# Patient Record
Sex: Female | Born: 1970 | Race: Black or African American | Hispanic: No | Marital: Married | Smoking: Current every day smoker
Health system: Southern US, Community
[De-identification: ages and names within clinical notes are randomized; demographics above are authoritative.]

## PROBLEM LIST (undated history)

## (undated) HISTORY — PX: TUBAL LIGATION: SHX77

---

## 2004-04-13 ENCOUNTER — Emergency Department (HOSPITAL_COMMUNITY): Admission: EM | Admit: 2004-04-13 | Discharge: 2004-04-13 | Payer: Self-pay | Admitting: Emergency Medicine

## 2006-07-05 ENCOUNTER — Emergency Department (HOSPITAL_COMMUNITY): Admission: EM | Admit: 2006-07-05 | Discharge: 2006-07-05 | Payer: Self-pay | Admitting: Emergency Medicine

## 2007-01-11 ENCOUNTER — Emergency Department (HOSPITAL_COMMUNITY): Admission: EM | Admit: 2007-01-11 | Discharge: 2007-01-11 | Payer: Self-pay | Admitting: Emergency Medicine

## 2010-05-12 ENCOUNTER — Emergency Department (HOSPITAL_COMMUNITY): Admission: EM | Admit: 2010-05-12 | Discharge: 2010-05-12 | Payer: Self-pay | Admitting: Family Medicine

## 2010-06-09 ENCOUNTER — Emergency Department (HOSPITAL_COMMUNITY): Admission: EM | Admit: 2010-06-09 | Discharge: 2010-06-09 | Payer: Self-pay | Admitting: Emergency Medicine

## 2011-02-11 LAB — POCT I-STAT, CHEM 8
BUN: 5 mg/dL — ABNORMAL LOW (ref 6–23)
Calcium, Ion: 1.19 mmol/L (ref 1.12–1.32)
Chloride: 103 mEq/L (ref 96–112)
Creatinine, Ser: 0.8 mg/dL (ref 0.4–1.2)
Potassium: 4.1 mEq/L (ref 3.5–5.1)
TCO2: 26 mmol/L (ref 0–100)

## 2013-05-06 ENCOUNTER — Emergency Department (HOSPITAL_COMMUNITY)
Admission: EM | Admit: 2013-05-06 | Discharge: 2013-05-06 | Disposition: A | Discharge status: Home / Self Care | Payer: Self-pay | Attending: Emergency Medicine | Admitting: Emergency Medicine

## 2013-05-06 ENCOUNTER — Encounter (HOSPITAL_COMMUNITY): Payer: Self-pay | Admitting: Emergency Medicine

## 2013-05-06 DIAGNOSIS — K0889 Other specified disorders of teeth and supporting structures: Secondary | ICD-10-CM

## 2013-05-06 DIAGNOSIS — F17200 Nicotine dependence, unspecified, uncomplicated: Secondary | ICD-10-CM | POA: Insufficient documentation

## 2013-05-06 DIAGNOSIS — H9209 Otalgia, unspecified ear: Secondary | ICD-10-CM | POA: Insufficient documentation

## 2013-05-06 DIAGNOSIS — K089 Disorder of teeth and supporting structures, unspecified: Secondary | ICD-10-CM | POA: Insufficient documentation

## 2013-05-06 DIAGNOSIS — K029 Dental caries, unspecified: Secondary | ICD-10-CM | POA: Insufficient documentation

## 2013-05-06 DIAGNOSIS — R51 Headache: Secondary | ICD-10-CM | POA: Insufficient documentation

## 2013-05-06 MED ORDER — PENICILLIN V POTASSIUM 500 MG PO TABS: 500.0000 mg | ORAL_TABLET | Freq: Four times a day (QID) | ORAL | Status: DC

## 2013-05-06 MED ORDER — HYDROCODONE-ACETAMINOPHEN 5-325 MG PO TABS: 2.0000 | ORAL_TABLET | ORAL | Status: DC | PRN

## 2013-05-06 MED ORDER — HYDROCODONE-ACETAMINOPHEN 5-325 MG PO TABS
2.0000 | ORAL_TABLET | Freq: Once | ORAL | Status: AC
  Administered 2013-05-06: 2 via ORAL
  Filled 2013-05-06: qty 2

## 2013-05-06 NOTE — ED Provider Notes (Signed)
  Medical screening examination/treatment/procedure(s) were performed by non-physician practitioner and as supervising physician I was immediately available for consultation/collaboration.    Tanika Bracco, MD 05/06/13 2356 

## 2013-05-06 NOTE — ED Provider Notes (Signed)
History     CSN: [id number]  Arrival date & time 05/06/13  1927   First MD Initiated Contact with Patient 05/06/13 2032      Chief Complaint  Patient presents with  . Dental Pain    (Consider location/radiation/quality/duration/timing/severity/associated sxs/prior treatment) The history is provided by the patient and medical records. No language interpreter was used.    Sherry Figueroa is a 42 y.o. female  with no medical problems presents to the Emergency Department complaining of gradual, persistent, progressively worsening pain in the upper right molar beginning yesterday and worsening today.  Pt states she has a hole in the tooth in question which has been there for some time.  Pt has taken OTC ibuprofen with only minor relief. Associated symptoms include headache.  Ibuprofen makes it better and nothing makes it worse.  Pt denies fevers, chills, neck pain, nausea, vomiting, otalgia, rash.  Pt does not have a regular dentist and has never had any teeth extracted.  Marland Kitchen     History reviewed. No pertinent past medical history.  Past Surgical History  Procedure Laterality Date  . Tubal ligation      No family history on file.  History  Substance Use Topics  . Smoking status: Current Every Day Smoker  . Smokeless tobacco: Not on file  . Alcohol Use: No    OB History   Grav Para Term Preterm Abortions TAB SAB Ect Mult Living                  Review of Systems  Constitutional: Negative for fever, chills and appetite change.  HENT: Positive for ear pain and dental problem. Negative for nosebleeds, facial swelling, rhinorrhea, drooling, trouble swallowing, neck pain, neck stiffness and postnasal drip.   Eyes: Negative for pain and redness.  Respiratory: Negative for cough and wheezing.   Cardiovascular: Negative for chest pain.  Gastrointestinal: Negative for nausea, vomiting and abdominal pain.  Skin: Negative for color change and rash.  Neurological: Positive for  headaches. Negative for weakness and light-headedness.  All other systems reviewed and are negative.    Allergies  Review of patient's allergies indicates no known allergies.  Home Medications   Current Outpatient Rx  Name  Route  Sig  Dispense  Refill  . ibuprofen (ADVIL,MOTRIN) 800 MG tablet   Oral   Take 800 mg by mouth once.         Marland Kitchen HYDROcodone-acetaminophen (NORCO/VICODIN) 5-325 MG per tablet   Oral   Take 2 tablets by mouth every 4 (four) hours as needed for pain.   6 tablet   0   . penicillin v potassium (VEETID) 500 MG tablet   Oral   Take 1 tablet (500 mg total) by mouth 4 (four) times daily.   40 tablet   0     BP 125/84  Pulse 91  Temp(Src) 99.4 F (37.4 C) (Oral)  Resp 22  SpO2 100%  LMP 05/03/2013  Physical Exam  Nursing note and vitals reviewed. Constitutional: She is oriented to person, place, and time. She appears well-developed and well-nourished.  HENT:  Head: Normocephalic.  Right Ear: Tympanic membrane, external ear and ear canal normal.  Left Ear: Tympanic membrane, external ear and ear canal normal.  Nose: Nose normal. Right sinus exhibits no maxillary sinus tenderness and no frontal sinus tenderness. Left sinus exhibits no maxillary sinus tenderness and no frontal sinus tenderness.  Mouth/Throat: Uvula is midline, oropharynx is clear and moist and mucous membranes are normal.  No oral lesions. Abnormal dentition. Dental caries present. No dental abscesses, edematous or lacerations. No oropharyngeal exudate, posterior oropharyngeal edema, posterior oropharyngeal erythema or tonsillar abscesses.    Eyes: Conjunctivae and EOM are normal. Pupils are equal, round, and reactive to light. Right eye exhibits no discharge. Left eye exhibits no discharge.  Neck: Normal range of motion. Neck supple.  Cardiovascular: Normal rate, regular rhythm, normal heart sounds and intact distal pulses.   No murmur heard. Pulmonary/Chest: Effort normal and breath  sounds normal. No respiratory distress. She has no wheezes.  Lymphadenopathy:    She has no cervical adenopathy.  Neurological: She is alert and oriented to person, place, and time. No cranial nerve deficit. She exhibits normal muscle tone. Coordination normal.  Skin: Skin is warm and dry. No erythema.  Psychiatric: She has a normal mood and affect.    ED Course  Procedures (including critical care time)  Labs Reviewed - No data to display No results found.   1. Pain, dental       MDM  Sherry Figueroa presents with dental pain.  Patient with toothache.  No gross abscess.  Exam unconcerning for Ludwig's angina or spread of infection.  Will treat with penicillin and pain medicine.  Urged patient to follow-up with dentist.  I have also discussed reasons to return immediately to the ER.  Patient expresses understanding and agrees with plan.     Dahlia Client Narciso Stoutenburg, PA-C 05/06/13 2356

## 2013-05-06 NOTE — ED Notes (Signed)
PT. REPORTS RIGHT UPPER MOLAR PAIN WITH CAVITY ONSET 2 DAYS AGO UNRELIEVED BY OTC PAIN MEDICATIONS .

## 2015-01-31 ENCOUNTER — Emergency Department (INDEPENDENT_AMBULATORY_CARE_PROVIDER_SITE_OTHER)
Admission: EM | Admit: 2015-01-31 | Discharge: 2015-01-31 | Disposition: A | Discharge status: Home / Self Care | Payer: Self-pay | Source: Home / Self Care | Attending: Emergency Medicine | Admitting: Emergency Medicine

## 2015-01-31 ENCOUNTER — Encounter (HOSPITAL_COMMUNITY): Payer: Self-pay | Admitting: *Deleted

## 2015-01-31 DIAGNOSIS — K029 Dental caries, unspecified: Secondary | ICD-10-CM

## 2015-01-31 DIAGNOSIS — K047 Periapical abscess without sinus: Secondary | ICD-10-CM

## 2015-01-31 MED ORDER — PENICILLIN V POTASSIUM 500 MG PO TABS: 500.0000 mg | ORAL_TABLET | Freq: Three times a day (TID) | ORAL | Status: DC

## 2015-01-31 MED ORDER — LIDOCAINE VISCOUS 2 % MT SOLN: OROMUCOSAL | Status: AC

## 2015-01-31 MED ORDER — IBUPROFEN 800 MG PO TABS
ORAL_TABLET | ORAL | Status: AC
  Filled 2015-01-31: qty 1

## 2015-01-31 MED ORDER — IBUPROFEN 800 MG PO TABS
800.0000 mg | ORAL_TABLET | Freq: Once | ORAL | Status: AC
  Administered 2015-01-31: 800 mg via ORAL

## 2015-01-31 MED ORDER — NAPROXEN 375 MG PO TABS: 375.0000 mg | ORAL_TABLET | Freq: Two times a day (BID) | ORAL | Status: DC

## 2015-01-31 NOTE — ED Provider Notes (Signed)
CSN: [id number]     Arrival date & time 01/31/15  1032 History   First MD Initiated Contact with Patient 01/31/15 1224     Chief Complaint  Patient presents with  . 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 No routine dental care Is a smoker Works in housekeeping at [employer] PCP: none Dentist: none Reports herself to be otherwise healthy.  The history is provided by the patient.    History reviewed. No pertinent past medical history. Past Surgical History  Procedure Laterality Date  . Tubal ligation     History reviewed. No pertinent family history. History  Substance Use Topics  . Smoking status: Current Every Day Smoker  . Smokeless tobacco: Not on file  . Alcohol Use: No   OB History    No data available     Review of Systems  All other systems reviewed and are negative.   Allergies  Review of patient's allergies indicates no known allergies.  Home Medications   Prior to Admission medications   Medication Sig Start Date End Date Taking? Authorizing Provider  HYDROcodone-acetaminophen (NORCO/VICODIN) 5-325 MG per tablet Take 2 tablets by mouth every 4 (four) hours as needed for pain. 05/06/13   Hannah Muthersbaugh, PA-C  ibuprofen (ADVIL,MOTRIN) 800 MG tablet Take 800 mg by mouth once.    Historical Provider, MD  lidocaine (XYLOCAINE) 2 % solution Apply to affected area using cotton swab Q3 hrs as needed for pain 01/31/15   Ria ClockJennifer Lee H Yessica Putnam, PA  naproxen (NAPROSYN) 375 MG tablet Take 1 tablet (375 mg total) by mouth 2 (two) times daily with a meal. As needed for pain 01/31/15   Ria ClockJennifer Lee H Shykeria Sakamoto, PA  penicillin v potassium (VEETID) 500 MG tablet Take 1 tablet (500 mg total) by mouth 3 (three) times daily. X  7 days 01/31/15   Jess BartersJennifer Lee H Chancelor Hardrick, PA   BP 110/75 mmHg  Pulse 71  Temp(Src) 97.9 F (36.6 C) (Oral)  Resp 12  SpO2 97%  LMP 01/31/2015 (LMP Unknown) Physical Exam  Constitutional: She is oriented to person, place, and time. She appears well-developed and well-nourished.  HENT:  Head: Normocephalic and atraumatic.  Right Ear: Hearing and external ear normal. No mastoid tenderness.  Left Ear: Hearing and external ear normal. No mastoid tenderness.  Nose: Nose normal.  Mouth/Throat: Uvula is midline, oropharynx is clear and moist and mucous membranes are normal. No oral lesions. No trismus in the jaw. Abnormal dentition. Dental abscesses and dental caries present. No uvula swelling.    Eyes: Conjunctivae are normal.  Cardiovascular: Normal rate.   Pulmonary/Chest: Effort normal.  Musculoskeletal: Normal range of motion.  Neurological: She is alert and oriented to person, place, and time.  Skin: Skin is warm and dry.  Psychiatric: She has a normal mood and affect. Her behavior is normal.  Nursing note and vitals reviewed.   ED Course  Procedures (including critical care time) Labs Review Labs Reviewed - No data to display  Imaging Review No results found.   MDM   1. Dental abscess   2. Dental cavity   PCN VK, xylocaine and naprosyn as directed with dental follow up. Provided patient with information sheet listing low cost dental options in the area    [employer] InternationalJennifer Lee H [place] Cantero, GeorgiaPA 01/31/15 1459

## 2015-01-31 NOTE — ED Notes (Signed)
Pt  Reports      Symptoms     Of         Of  A  Toothache       And         Some  Pain  r   Side  Of  Face       That  Started  Last pm    -  Both  Upper  And  Lower               Pt  Sitting upright  On  The  Exam table  Speaking  In  Complete  sentances

## 2015-01-31 NOTE — Discharge Instructions (Signed)
Dental Abscess °A dental abscess is a collection of infected fluid (pus) from a bacterial infection in the inner part of the tooth (pulp). It usually occurs at the end of the tooth's root.  °CAUSES  °· Severe tooth decay. °· Trauma to the tooth that allows bacteria to enter into the pulp, such as a broken or chipped tooth. °SYMPTOMS  °· Severe pain in and around the infected tooth. °· Swelling and redness around the abscessed tooth or in the mouth or face. °· Tenderness. °· Pus drainage. °· Bad breath. °· Bitter taste in the mouth. °· Difficulty swallowing. °· Difficulty opening the mouth. °· Nausea. °· Vomiting. °· Chills. °· Swollen neck glands. °DIAGNOSIS  °· A medical and dental history will be taken. °· An examination will be performed by tapping on the abscessed tooth. °· X-rays may be taken of the tooth to identify the abscess. °TREATMENT °The goal of treatment is to eliminate the infection. You may be prescribed antibiotic medicine to stop the infection from spreading. A root canal may be performed to save the tooth. If the tooth cannot be saved, it may be pulled (extracted) and the abscess may be drained.  °HOME CARE INSTRUCTIONS °· Only take over-the-counter or prescription medicines for pain, fever, or discomfort as directed by your caregiver. °· Rinse your mouth (gargle) often with salt water (¼ tsp salt in 8 oz [250 ml] of warm water) to relieve pain or swelling. °· Do not drive after taking pain medicine (narcotics). °· Do not apply heat to the outside of your face. °· Return to your dentist for further treatment as directed. °SEEK MEDICAL CARE IF: °· Your pain is not helped by medicine. °· Your pain is getting worse instead of better. °SEEK IMMEDIATE MEDICAL CARE IF: °· You have a fever or persistent symptoms for more than 2-3 days. °· You have a fever and your symptoms suddenly get worse. °· You have chills or a very bad headache. °· You have problems breathing or swallowing. °· You have trouble  opening your mouth. °· You have swelling in the neck or around the eye. °Document Released: 11/12/2005 Document Revised: 08/06/2012 Document Reviewed: 02/20/2011 °ExitCare® Patient Information ©2015 [employer]. This information is not intended to replace advice given to you by your health care provider. Make sure you discuss any questions you have with your health care provider. ° °Dental Care and Dentist Visits °Dental care supports good overall health. Regular dental visits can also help you avoid dental pain, bleeding, infection, and other more serious health problems in the future. It is important to keep the mouth healthy because diseases in the teeth, gums, and other oral tissues can spread to other areas of the body. Some problems, such as diabetes, heart disease, and pre-term labor have been associated with poor oral health.  °See your dentist every 6 months. If you experience emergency problems such as a toothache or broken tooth, go to the dentist right away. If you see your dentist regularly, you may catch problems early. It is easier to be treated for problems in the early stages.  °WHAT TO EXPECT AT A DENTIST VISIT  °Your dentist will look for many common oral health problems and recommend proper treatment. At your regular dental visit, you can expect: °· Gentle cleaning of the teeth and gums. This includes scraping and polishing. This helps to remove the sticky substance around the teeth and gums (plaque). Plaque forms in the mouth shortly after eating. Over time, plaque hardens   on the teeth as tartar. If tartar is not removed regularly, it can cause problems. Cleaning also helps remove stains.  Periodic X-rays. These pictures of the teeth and supporting bone will help your dentist assess the health of your teeth.  Periodic fluoride treatments. Fluoride is a natural mineral shown to help strengthen teeth. Fluoride treatmentinvolves applying a fluoride gel or varnish to the teeth. It is most  commonly done in children.  Examination of the mouth, tongue, jaws, teeth, and gums to look for any oral health problems, such as:  Cavities (dental caries). This is decay on the tooth caused by plaque, sugar, and acid in the mouth. It is best to catch a cavity when it is small.  Inflammation of the gums caused by plaque buildup (gingivitis).  Problems with the mouth or malformed or misaligned teeth.  Oral cancer or other diseases of the soft tissues or jaws. KEEP YOUR TEETH AND GUMS HEALTHY For healthy teeth and gums, follow these general guidelines as well as your dentist's specific advice:  Have your teeth professionally cleaned at the dentist every 6 months.  Brush twice daily with a fluoride toothpaste.  Floss your teeth daily.  Ask your dentist if you need fluoride supplements, treatments, or fluoride toothpaste.  Eat a healthy diet. Reduce foods and drinks with added sugar.  Avoid smoking. TREATMENT FOR ORAL HEALTH PROBLEMS If you have oral health problems, treatment varies depending on the conditions present in your teeth and gums.  Your caregiver will most likely recommend good oral hygiene at each visit.  For cavities, gingivitis, or other oral health disease, your caregiver will perform a procedure to treat the problem. This is typically done at a separate appointment. Sometimes your caregiver will refer you to another dental specialist for specific tooth problems or for surgery. SEEK IMMEDIATE DENTAL CARE IF:  You have pain, bleeding, or soreness in the gum, tooth, jaw, or mouth area.  A permanent tooth becomes loose or separated from the gum socket.  You experience a blow or injury to the mouth or jaw area. Document Released: 07/25/2011 Document Revised: 02/04/2012 Document Reviewed: 07/25/2011 [facility] Patient Information 2015 [place], [place]. This information is not intended to replace advice given to you by your health care provider. Make sure you discuss any  questions you have with your health care provider.  Dental Caries Dental caries (also called tooth decay) is the most common oral disease. It can occur at any age but is more common in children and young adults.  HOW DENTAL CARIES DEVELOPS  The process of decay begins when bacteria and foods (particularly sugars and starches) combine in your mouth to produce plaque. Plaque is a substance that sticks to the hard, outer surface of a tooth (enamel). The bacteria in plaque produce acids that attack enamel. These acids may also attack the root surface of a tooth (cementum) if it is exposed. Repeated attacks dissolve these surfaces and create holes in the tooth (cavities). If left untreated, the acids destroy the other layers of the tooth.  RISK FACTORS  Frequent sipping of sugary beverages.   Frequent snacking on sugary and starchy foods, especially those that easily get stuck in the teeth.   Poor oral hygiene.   Dry mouth.   Substance abuse such as methamphetamine abuse.   Broken or poor-fitting dental restorations.   Eating disorders.   Gastroesophageal reflux disease (GERD).   Certain radiation treatments to the head and neck. SYMPTOMS In the early stages of dental caries, symptoms  are seldom present. Sometimes white, chalky areas may be seen on the enamel or other tooth layers. In later stages, symptoms may include:  Pits and holes on the enamel.  Toothache after sweet, hot, or cold foods or drinks are consumed.  Pain around the tooth.  Swelling around the tooth. DIAGNOSIS  Most of the time, dental caries is detected during a regular dental checkup. A diagnosis is made after a thorough medical and dental history is taken and the surfaces of your teeth are checked for signs of dental caries. Sometimes special instruments, such as lasers, are used to check for dental caries. Dental X-ray exams may be taken so that areas not visible to the eye (such as between the contact  areas of the teeth) can be checked for cavities.  TREATMENT  If dental caries is in its early stages, it may be reversed with a fluoride treatment or an application of a remineralizing agent at the dental office. Thorough brushing and flossing at home is needed to aid these treatments. If it is in its later stages, treatment depends on the location and extent of tooth destruction:   If a small area of the tooth has been destroyed, the destroyed area will be removed and cavities will be filled with a material such as gold, silver amalgam, or composite resin.   If a large area of the tooth has been destroyed, the destroyed area will be removed and a cap (crown) will be fitted over the remaining tooth structure.   If the center part of the tooth (pulp) is affected, a procedure called a root canal will be needed before a filling or crown can be placed.   If most of the tooth has been destroyed, the tooth may need to be pulled (extracted). HOME CARE INSTRUCTIONS You can prevent, stop, or reverse dental caries at home by practicing good oral hygiene. Good oral hygiene includes:  Thoroughly cleaning your teeth at least twice a day with a toothbrush and dental floss.   Using a fluoride toothpaste. A fluoride mouth rinse may also be used if recommended by your dentist or health care provider.   Restricting the amount of sugary and starchy foods and sugary liquids you consume.   Avoiding frequent snacking on these foods and sipping of these liquids.   Keeping regular visits with a dentist for checkups and cleanings. PREVENTION   Practice good oral hygiene.  Consider a dental sealant. A dental sealant is a coating material that is applied by your dentist to the pits and grooves of teeth. The sealant prevents food from being trapped in them. It may protect the teeth for several years.  Ask about fluoride supplements if you live in a community without fluorinated water or with water that has a  low fluoride content. Use fluoride supplements as directed by your dentist or health care provider.  Allow fluoride varnish applications to teeth if directed by your dentist or health care provider. Document Released: 08/04/2002 Document Revised: 03/29/2014 Document Reviewed: 11/14/2012 [facility] Patient Information 2015 [place], [place]. This information is not intended to replace advice given to you by your health care provider. Make sure you discuss any questions you have with your health care provider.

## 2015-04-02 ENCOUNTER — Emergency Department (HOSPITAL_COMMUNITY): Payer: No Typology Code available for payment source

## 2015-04-02 ENCOUNTER — Encounter (HOSPITAL_COMMUNITY): Payer: Self-pay | Admitting: *Deleted

## 2015-04-02 ENCOUNTER — Emergency Department (HOSPITAL_COMMUNITY)
Admission: EM | Admit: 2015-04-02 | Discharge: 2015-04-03 | Disposition: A | Discharge status: Home / Self Care | Payer: No Typology Code available for payment source | Attending: Emergency Medicine | Admitting: Emergency Medicine

## 2015-04-02 DIAGNOSIS — Y998 Other external cause status: Secondary | ICD-10-CM | POA: Diagnosis not present

## 2015-04-02 DIAGNOSIS — Z720 Tobacco use: Secondary | ICD-10-CM | POA: Insufficient documentation

## 2015-04-02 DIAGNOSIS — S161XXA Strain of muscle, fascia and tendon at neck level, initial encounter: Secondary | ICD-10-CM

## 2015-04-02 DIAGNOSIS — Y92410 Unspecified street and highway as the place of occurrence of the external cause: Secondary | ICD-10-CM | POA: Diagnosis not present

## 2015-04-02 DIAGNOSIS — Y9389 Activity, other specified: Secondary | ICD-10-CM | POA: Insufficient documentation

## 2015-04-02 DIAGNOSIS — S20212A Contusion of left front wall of thorax, initial encounter: Secondary | ICD-10-CM

## 2015-04-02 DIAGNOSIS — S199XXA Unspecified injury of neck, initial encounter: Secondary | ICD-10-CM | POA: Diagnosis present

## 2015-04-02 MED ORDER — OXYCODONE-ACETAMINOPHEN 5-325 MG PO TABS
1.0000 | ORAL_TABLET | Freq: Once | ORAL | Status: AC
  Administered 2015-04-02: 1 via ORAL
  Filled 2015-04-02: qty 1
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
## 2015-04-03 MED ORDER — HYDROCODONE-ACETAMINOPHEN 5-325 MG PO TABS: 1.0000 | ORAL_TABLET | Freq: Four times a day (QID) | ORAL | Status: AC | PRN

## 2015-04-03 MED ORDER — IBUPROFEN 800 MG PO TABS: 800.0000 mg | ORAL_TABLET | Freq: Three times a day (TID) | ORAL | Status: DC | PRN

## 2015-04-03 NOTE — ED Notes (Signed)
Pt stable, ambulatory, states understanding of discharge instructions 

## 2015-04-03 NOTE — Discharge Instructions (Signed)
Return here as needed.  Follow-up with a primary care doctor.  Your x-rays did not show any broken bones or abnormalities.  He is ice and heat on the areas that are sore
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
## 2015-07-08 ENCOUNTER — Encounter (HOSPITAL_COMMUNITY): Payer: Self-pay | Admitting: Emergency Medicine

## 2015-07-08 ENCOUNTER — Emergency Department (HOSPITAL_COMMUNITY): Payer: Self-pay

## 2015-07-08 DIAGNOSIS — R0602 Shortness of breath: Secondary | ICD-10-CM | POA: Insufficient documentation

## 2015-07-08 DIAGNOSIS — Z720 Tobacco use: Secondary | ICD-10-CM | POA: Insufficient documentation

## 2015-07-08 DIAGNOSIS — R110 Nausea: Secondary | ICD-10-CM | POA: Insufficient documentation

## 2015-07-08 DIAGNOSIS — R0789 Other chest pain: Secondary | ICD-10-CM | POA: Insufficient documentation

## 2015-07-08 DIAGNOSIS — R42 Dizziness and giddiness: Secondary | ICD-10-CM | POA: Insufficient documentation

## 2015-07-08 DIAGNOSIS — R61 Generalized hyperhidrosis: Secondary | ICD-10-CM | POA: Insufficient documentation

## 2015-07-08 LAB — BASIC METABOLIC PANEL
Anion gap: 6 (ref 5–15)
BUN: 9 mg/dL (ref 6–20)
CHLORIDE: 104 mmol/L (ref 101–111)
CO2: 27 mmol/L (ref 22–32)
Calcium: 8.7 mg/dL — ABNORMAL LOW (ref 8.9–10.3)
Creatinine, Ser: 1.05 mg/dL — ABNORMAL HIGH (ref 0.44–1.00)
GFR calc Af Amer: >60 mL/min (ref >60)
GLUCOSE: 93 mg/dL (ref 65–99)
POTASSIUM: 3.8 mmol/L (ref 3.5–5.1)
SODIUM: 137 mmol/L (ref 135–145)

## 2015-07-08 LAB — CBC
HEMATOCRIT: 39 % (ref 36.0–46.0)
Hemoglobin: 13.7 g/dL (ref 12.0–15.0)
MCH: 31 pg (ref 26.0–34.0)
MCHC: 35.1 g/dL (ref 30.0–36.0)
MCV: 88.2 fL (ref 78.0–100.0)
PLATELETS: 323 10*3/uL (ref 150–400)
RBC: 4.42 MIL/uL (ref 3.87–5.11)
RDW: 13.6 % (ref 11.5–15.5)
WBC: 7.7 10*3/uL (ref 4.0–10.5)

## 2015-07-08 LAB — I-STAT TROPONIN, ED: Troponin i, poc: 0 ng/mL (ref 0.00–0.08)
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
## 2015-07-09 ENCOUNTER — Emergency Department (HOSPITAL_COMMUNITY)
Admission: EM | Admit: 2015-07-09 | Discharge: 2015-07-09 | Disposition: A | Discharge status: Home / Self Care | Payer: Self-pay | Attending: Emergency Medicine | Admitting: Emergency Medicine

## 2015-07-09 DIAGNOSIS — R0789 Other chest pain: Secondary | ICD-10-CM

## 2015-07-09 LAB — I-STAT TROPONIN, ED: TROPONIN I, POC: 0 ng/mL (ref 0.00–0.08)

## 2015-07-09 MED ORDER — IBUPROFEN 800 MG PO TABS
800.0000 mg | ORAL_TABLET | Freq: Once | ORAL | Status: AC
  Administered 2015-07-09: 800 mg via ORAL
  Filled 2015-07-09: qty 1

## 2015-07-09 MED ORDER — TRAMADOL HCL 50 MG PO TABS
50.0000 mg | ORAL_TABLET | Freq: Four times a day (QID) | ORAL | Status: DC | PRN
Start: 2015-07-09 — End: 2020-11-06

## 2015-07-09 MED ORDER — IBUPROFEN 800 MG PO TABS: 800.0000 mg | ORAL_TABLET | Freq: Three times a day (TID) | ORAL | Status: DC | PRN
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
## 2015-07-09 NOTE — Discharge Instructions (Signed)
Chest Wall Pain °Chest wall pain is pain in or around the bones and muscles of your chest. It may take up to 6 weeks to get better. It may take longer if you must stay physically active in your work and activities.  °CAUSES  °Chest wall pain may happen on its own. However, it may be caused by: °· A viral illness like the flu. °· Injury. °· Coughing. °· Exercise. °· Arthritis. °· Fibromyalgia. °· Shingles. °HOME CARE INSTRUCTIONS  °· Avoid overtiring physical activity. Try not to strain or perform activities that cause pain. This includes any activities using your chest or your abdominal and side muscles, especially if heavy weights are used. °· Put ice on the sore area. °¨ Put ice in a plastic bag. °¨ Place a towel between your skin and the bag. °¨ Leave the ice on for 15-20 minutes per hour while awake for the first 2 days. °· Only take over-the-counter or prescription medicines for pain, discomfort, or fever as directed by your caregiver. °SEEK IMMEDIATE MEDICAL CARE IF:  °· Your pain increases, or you are very uncomfortable. °· You have a fever. °· Your chest pain becomes worse. °· You have new, unexplained symptoms. °· You have nausea or vomiting. °· You feel sweaty or lightheaded. °· You have a cough with phlegm (sputum), or you cough up blood. °MAKE SURE YOU:  °· Understand these instructions. °· Will watch your condition. °· Will get help right away if you are not doing well or get worse. °Document Released: 11/12/2005 Document Revised: 02/04/2012 Document Reviewed: 07/09/2011 °ExitCare® Patient Information ©2015 [employer]. This information is not intended to replace advice given to you by your health care provider. Make sure you discuss any questions you have with your health care provider. ° ° °Emergency Department Resource Guide °1) Find a Doctor and Pay Out of Pocket °Although you won't have to find out who is covered by your insurance plan, it is a good idea to ask around and get recommendations. You  will then need to call the office and see if the doctor you have chosen will accept you as a new patient and what types of options they offer for patients who are self-pay. Some doctors offer discounts or will set up payment plans for their patients who do not have insurance, but you will need to ask so you aren't surprised when you get to your appointment. ° °2) Contact Your Local Health Department °Not all health departments have doctors that can see patients for sick visits, but many do, so it is worth a call to see if yours does. If you don't know where your local health department is, you can check in your phone book. The CDC also has a tool to help you locate your state's health department, and many state websites also have listings of all of their local health departments. ° °3) Find a Walk-in Clinic °If your illness is not likely to be very severe or complicated, you may want to try a walk in clinic. These are popping up all over the country in pharmacies, drugstores, and shopping centers. They're usually staffed by nurse practitioners or physician assistants that have been trained to treat common illnesses and complaints. They're usually fairly quick and inexpensive. However, if you have serious medical issues or chronic medical problems, these are probably not your best option. ° °No Primary Care Doctor: °- Call [facility] at  832-8000 - they can help you locate a primary care doctor that    accepts your insurance, provides certain services, etc. °- Physician Referral Service- 1-800-533-3463 ° °Chronic Pain Problems: °Organization         Address  Phone   Notes  °[facility]  (336) 297-2271 Patients need to be referred by their primary care doctor.  ° °Medication Assistance: °Organization         Address  Phone   Notes  °[place] [facility] [address] °[place], [place] [address] (336) 641-8030 --Must be a resident of [place] °-- Must  have NO insurance coverage whatsoever (no Medicaid/ Medicare, etc.) °-- The pt. MUST have a primary care doctor that directs their care regularly and follows them in the community °  °[facility]  (866) 331-1348   °[employer]  (888) 892-1162   ° °Agencies that provide inexpensive medical care: °Organization         Address  Phone   Notes  °[facility]  (336) 832-8035   °[facility]    (336) 832-7272   °[facility] [address] °[place], [place] [address] (336) 832-4777   °[facility] [address], °[place] (336) 271-4999   °[facility]    (336) 373-0678   °[facility]    (336) 272-1050   °[facility] ° [address], [place] Phone:  (336) 832-4444, Fax:  (336) 832-4440 Hours of Operation:  9 am - 6 pm, M-F.  Also accepts Medicaid/Medicare and self-pay.  °[facility] ° [address], [place] Phone: (336) 832-3150, Fax: (336) 832-3151. Hours of Operation:  8:30 am - 5:30 pm, M-F.  Also accepts Medicaid and self-pay.  °[facility] [place] [address], [place] Phone: (336) 878-6027   °[facility] [address], [place], [place] (336)723-1848, Ext. 123 Mondays & Thursdays: 7-9 AM.  First 15 patients are seen on a first come, first serve basis. °  ° °Medicaid-accepting [place] Providers: ° °Organization         Address  Phone   Notes  °[facility] [address], [place] (336) 641-2100 Also accepts self-pay patients.  °[facility] [address], [place] ° (336) 856-9996   °[facility] [address], [place] (336) 288-8857   °[facility] [address] [place] [address], [place] (336) 299-7000   °Veita Bland [address], [place]  ° (336) 373-1557 Only accepts [place] Access Medicaid patients after  they have their name applied to their card.  ° °Self-Pay (no insurance) in [place]: ° °Organization         Address  Phone   Notes  °Sickle Cell Patients, [facility] [address], [place] (336) 832-1970   °[facility] [address], [place] (336) 832-4400   °[facility] [place] ° [address], [place] (336) 992-4800   °[facility]/Dr. Osei-Bonsu ° [address] [place] [address], [place] or [address], [place] (336) 841-8500 Phone number for both [place] and [place] locations is the same.  °[facility] [address], [place] (336) 299-0000   °[facility] [address] [place] [address], [place] or [address] (336) 852-7530 °(336) 878-2260   °[facility] [address], [place] (336) 350-1642, phone; (336) 294-5005, fax Sees patients 1st and 3rd Saturday of every month.  Must not qualify for public or private insurance (i.e. Medicaid, Medicare, [place] Health Choice, Veterans' Benefits) • Household income should be no more than 200% of the poverty level •The clinic cannot treat you if you are pregnant or think you are pregnant • Sexually transmitted diseases are not treated at the clinic.  ° ° °Dental Care: °Organization         Address  Phone  Notes  °[facility] [address], [place] (336) 641-6152 Accepts children up to age 21 who are enrolled in Medicaid or [employer] Health Choice; pregnant women with a Medicaid card; and children who have applied for Medicaid or [place] Health Choice, but were declined, whose parents can pay a reduced fee at time of service.  °[facility] [place]  [address], [place] (336) 641-7733 Accepts children up to age 21 who are enrolled in Medicaid or [place] Health Choice; pregnant women with a Medicaid card; and children who  have applied for Medicaid or [employer] Health Choice, but were declined, whose parents can pay a reduced fee at time of service.  °[facility] ° [address], [place] (336) 641-4533 Patients are seen by appointment only. Walk-ins are not accepted. [facility] will see patients 18 years of age and older. °Monday - Tuesday (8am-5pm) °Most Wednesdays (8:30-5pm) °$30 per visit, cash only  °[facility] ° [address], [place] (336) 641-4533 Patients are seen by appointment only. Walk-ins are not accepted. [facility] will see patients 18 years of age and older. °One Wednesday Evening (Monthly: Volunteer Based).  $30 per visit, cash only  °[facility]  (919) 537-3737 for adults; Children under age 4, call Graduate Pediatric Dentistry at (919) [phone]. Children aged 4-14, please call (919) [phone] to request a pediatric application. ° Dental services are provided in all areas of dental care including fillings, crowns and bridges, complete and partial dentures, implants, gum treatment, root canals, and extractions. Preventive care is also provided. Treatment is provided to both adults and children. °Patients are selected via a lottery and there is often a waiting list. °  °[facility] [address], °[place] ° (336) 763-8833 [email or web address] °  °[facility] [address], [place], [place] (336)723-1848, Ext. 123 Second and Fourth Thursday of each month, opens at 6:30 AM; Clinic ends at 9 AM.  Patients are seen on a first-come first-served basis, and a limited number are seen during each clinic.  ° °[facility] ° [address], [place], [place] (336) 723-7904   Eligibility Requirements °You must have lived in [place], [place], or [place] counties for at least the last three months. °  You cannot be eligible for state or federal sponsored healthcare insurance, including Veterans  Administration, Medicaid, or Medicare. °  You generally cannot be eligible for healthcare insurance through your employer.  °  How to apply: °Eligibility screenings are held every Tuesday and Wednesday afternoon from 1:00 pm until 4:00 pm. You do not need an appointment for the interview!  °[facility] [address], [place], [place] 336-631-2330   °[facility]  336-342-8273   °[facility]  336-703-3100   °[facility]    336-570-6415   ° °Behavioral Health Resources in the Community: °Intensive Outpatient Programs °Organization         Address  Phone  Notes  °[facility] [address], [place], [place] 336-878-6098   °[facility] [address], [place], [place] 336-832-9800   °[facility] [address], [place], [place] ° 336-882-2125   °[facility] [address],  °[place], [place] 1-800-853-5163 or 336-641-4981   °Substance Abuse Resources °Organization         Address  Phone  Notes  °[facility]  336-882-2125   °[facility]  336-784-9470   °[facility]  336-285-9073   °[facility]  336-845-3988   °Residential & Outpatient Substance Abuse Program  1-800-659-3381   °Psychological Services °Organization         Address  Phone  Notes  °[facility]  336- 832-9600   °[facility]  336- 378-7881   °[facility] [address], [place] 1-800-853-5163 or 336-641-4981   ° °Mobile Crisis Teams °Organization         Address  Phone  Notes  °[facility], Mobile Crisis Care Unit  1-877-626-1772   °Assertive °[facility] ° [address] [place], [place] 336-834-9664   °Sharon DeEsch [address] °[place] 336-554-5454   ° °Self-Help/Support Groups °Organization         Address  Phone             Notes  °[facility] -  variety of support groups  336- 373-1402 Call for more information  °Narcotics Anonymous (NA), [facility] [address], °[place]  2 meetings at this location  ° °Residential Treatment Programs °Organization         Address  Phone  Notes  °[facility] [address],    °[place]  1-866-801-8205   °[facility] ° [address], [place], [place] 704-293-8524   °[facility] [address], [place] 336-845-3988 Admissions: 8am-3pm M-F  °[facility] [address]    °[place], [place] 336-841-1104   °[facility] [address], [place], [place] 336-379-7146   °[facility] [address]  °[place], [place] 336-285-9073   °[facility] Intensive Outpatient [address], [place], [place] 336-852-3033   °[facility] [address]  °[place], [place] 1-877-615-2722 or 336-784-9470   °[facility] [address] [place], [place] 336-227-7417 Accepts Medicaid  °[facility] [address]  ° [place] 1-800-659-3381 Substance Abuse/Addiction Treatment  ° °[place] Behavioral Health Resources °Organization         Address  Phone  Notes  °[facility]  (888) 581-9988   °Julie Brannon, PhD [address] [place], [place]   (336) 349-5553 or (336) 951-0000   °[facility]   [address] °[place], [place] (336) 349-4454   °[facility] [address], [place], [place] (336) 342-8316 Insurance/Medicaid/sponsorship through [facility]  °[facility] [address]                                    [place],  (336) 342-8316 Therapy/tele-psych/case  °[facility] [address]  ° [place], [place] (336) 349-2233    °Dr. Arfeen  (336) 349-4544   °[facility] [place]  [employer] [place] [facility] 1) [address], [place] °2) [address], [place] °3)  [address], [place] (336) 349-3220 °(336) 342-7768 ° °(336) 342-8140   °[place] [facility] (336) 342-1394 or (336) 342-3537 (After Hours)    ° ° ° °

## 2015-10-25 IMAGING — DX DG CHEST 2V
2 series · 2 of 2 positions shown · non-contrast
Comparison: Chest x-ray 04/02/2015.

CLINICAL DATA: Shortness breath. Chest pain. Left arm tingling.
Nausea for 1 day.

EXAM:
CHEST - 2 VIEW

[chest pa]
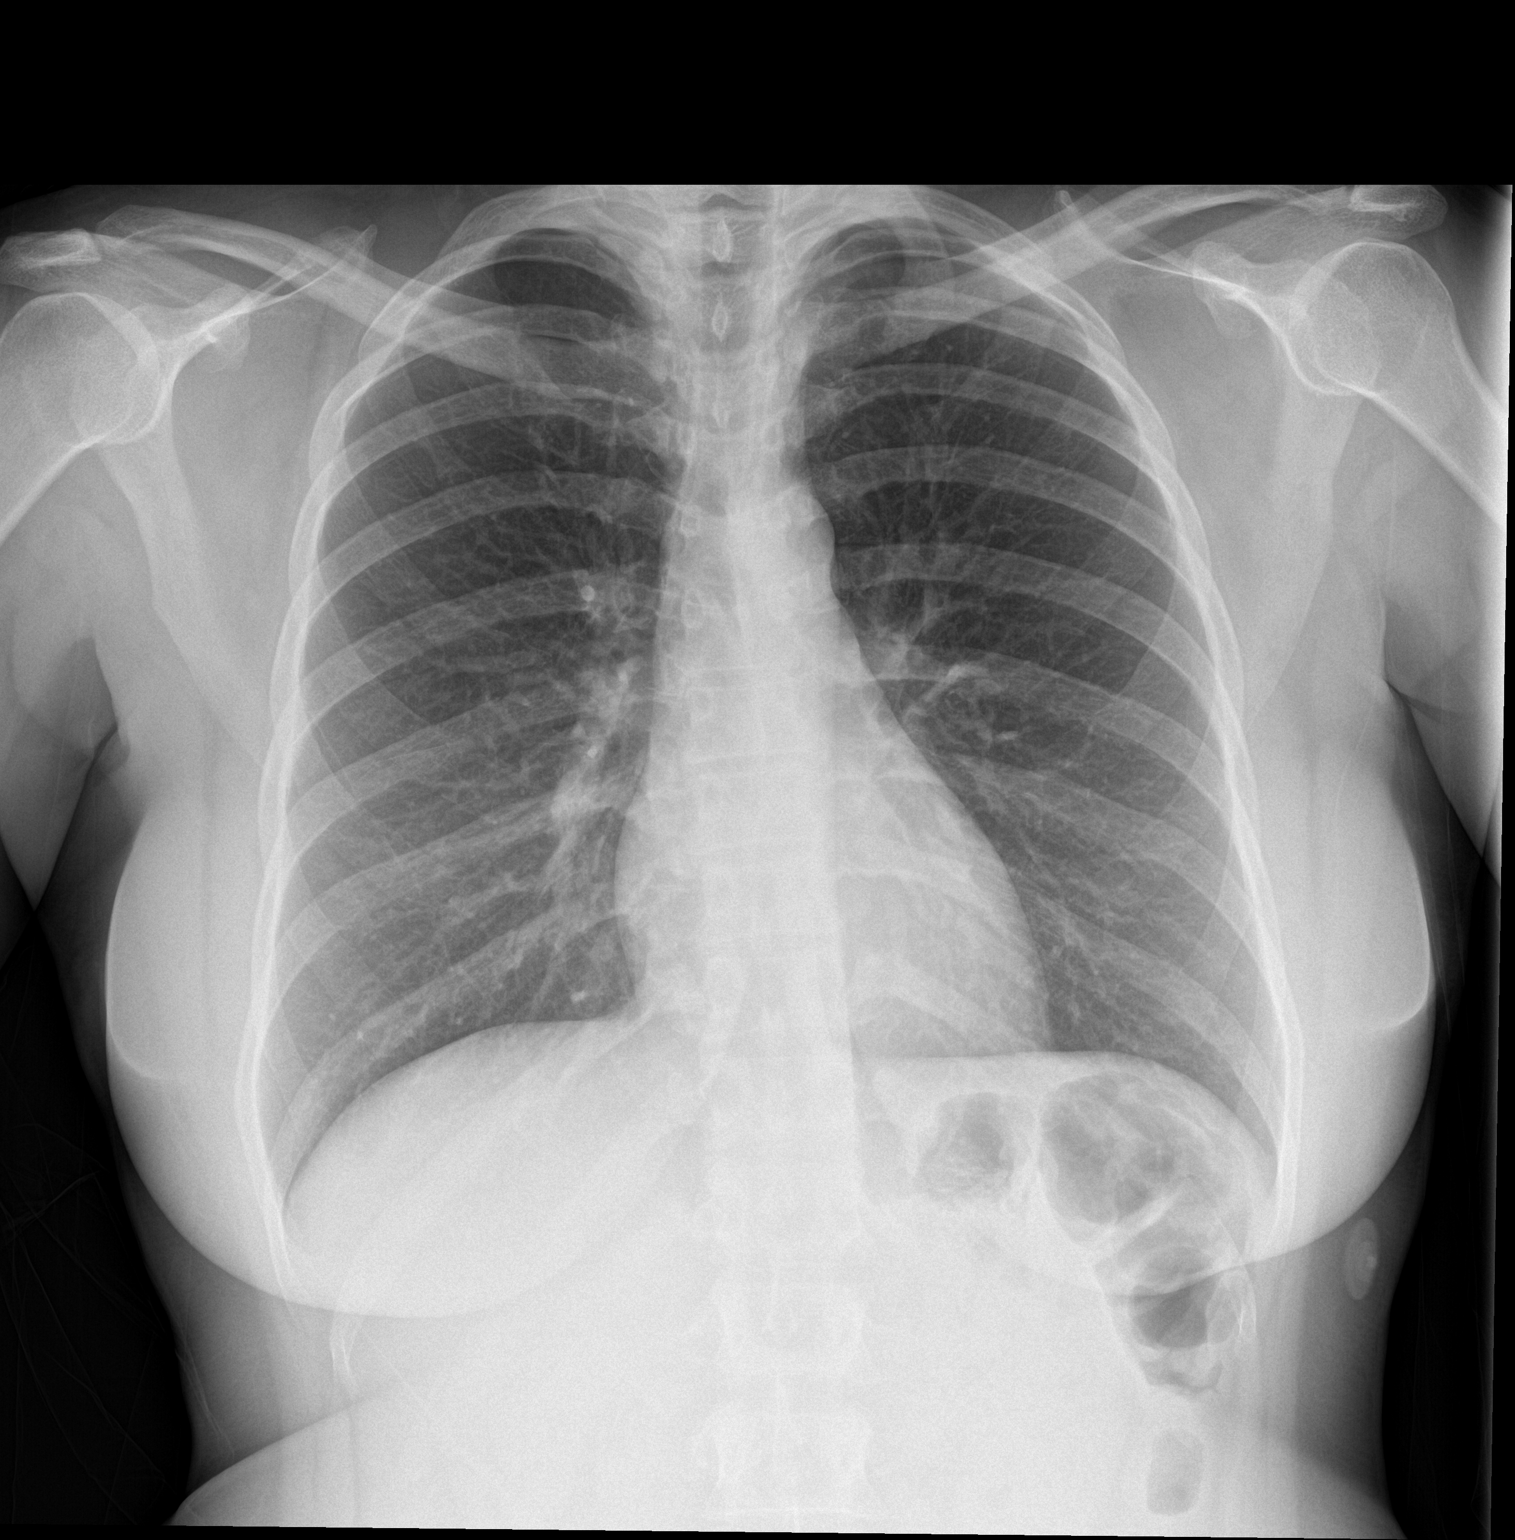

[chest ap]
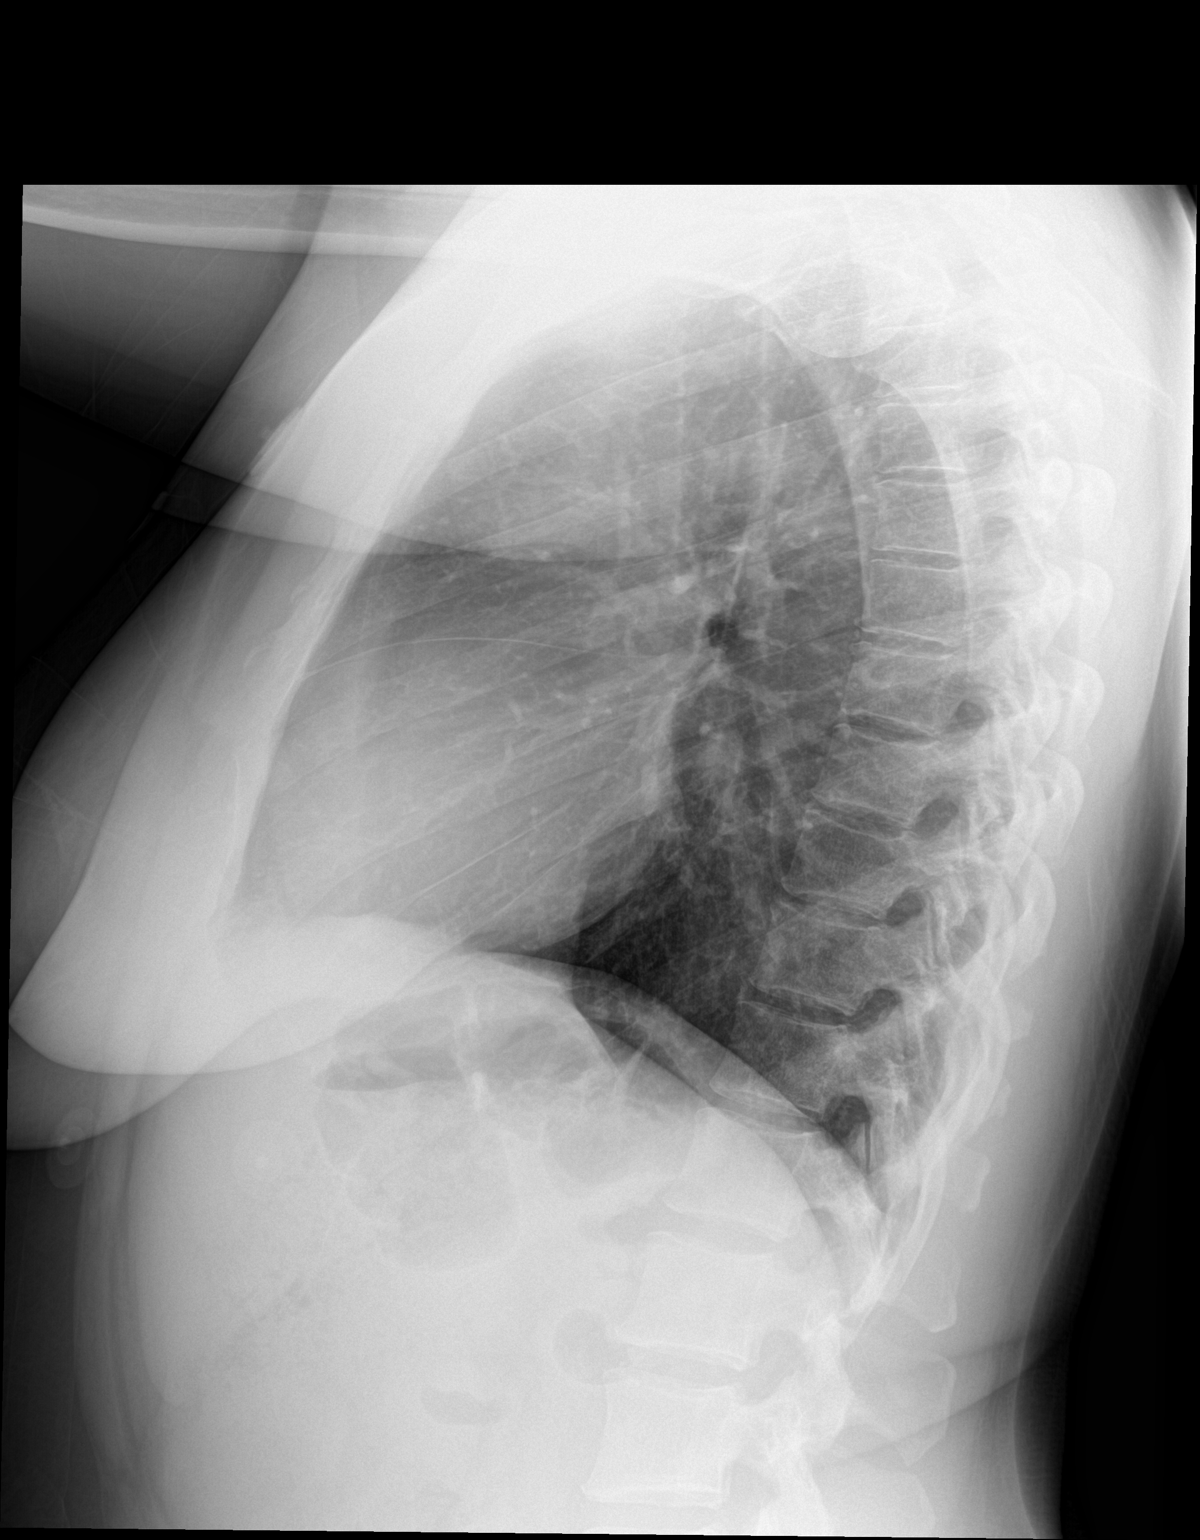

[2 of 2 positions shown; findings below may reference images not displayed]

FINDINGS: The heart size and mediastinal contours are within normal limits.
Both lungs are clear. The visualized skeletal structures are
unremarkable.
IMPRESSION: Negative two view chest x-ray

## 2020-11-06 ENCOUNTER — Other Ambulatory Visit: Payer: Self-pay

## 2020-11-06 ENCOUNTER — Encounter (HOSPITAL_COMMUNITY): Payer: Self-pay | Admitting: Emergency Medicine

## 2020-11-06 ENCOUNTER — Ambulatory Visit (HOSPITAL_COMMUNITY)
Admission: EM | Admit: 2020-11-06 | Discharge: 2020-11-06 | Disposition: A | Discharge status: Home / Self Care | Payer: No Typology Code available for payment source | Attending: Internal Medicine | Admitting: Internal Medicine

## 2020-11-06 DIAGNOSIS — M7918 Myalgia, other site: Secondary | ICD-10-CM

## 2020-11-06 MED ORDER — KETOROLAC TROMETHAMINE 60 MG/2ML IM SOLN
INTRAMUSCULAR | Status: AC
  Filled 2020-11-06: qty 2

## 2020-11-06 MED ORDER — METHOCARBAMOL 500 MG PO TABS: 500.0000 mg | ORAL_TABLET | Freq: Four times a day (QID) | ORAL | 0 refills | Status: AC | PRN

## 2020-11-06 MED ORDER — KETOROLAC TROMETHAMINE 60 MG/2ML IM SOLN
60.0000 mg | Freq: Once | INTRAMUSCULAR | Status: AC
  Administered 2020-11-06: 60 mg via INTRAMUSCULAR

## 2020-11-06 MED ORDER — IBUPROFEN 800 MG PO TABS: 800.0000 mg | ORAL_TABLET | Freq: Three times a day (TID) | ORAL | 0 refills | Status: AC | PRN

## 2020-11-06 NOTE — Discharge Instructions (Signed)
Take medication as prescribed. Do not start Ibuprofen until tomorrow as you had a shot today in the clinic. The muscle relaxer will make you tired so no taking before you drive. Heating pad to sore areas several times daily for 20 minutes at a time.
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
